# Patient Record
Sex: Female | Born: 1970 | Race: Black or African American | Hispanic: No | Marital: Single | Smoking: Never smoker
Health system: Southern US, Community
[De-identification: ages and names within clinical notes are randomized; demographics above are authoritative.]

## PROBLEM LIST (undated history)

## (undated) DIAGNOSIS — G4730 Sleep apnea, unspecified: Secondary | ICD-10-CM

## (undated) DIAGNOSIS — G43909 Migraine, unspecified, not intractable, without status migrainosus: Secondary | ICD-10-CM

---

## 2014-02-13 ENCOUNTER — Emergency Department (HOSPITAL_COMMUNITY)
Admission: EM | Admit: 2014-02-13 | Discharge: 2014-02-13 | Disposition: A | Discharge status: Home / Self Care | Payer: Medicare Other | Attending: Emergency Medicine | Admitting: Emergency Medicine

## 2014-02-13 ENCOUNTER — Emergency Department (HOSPITAL_COMMUNITY): Payer: Medicare Other

## 2014-02-13 ENCOUNTER — Encounter (HOSPITAL_COMMUNITY): Payer: Self-pay | Admitting: Emergency Medicine

## 2014-02-13 DIAGNOSIS — Z3202 Encounter for pregnancy test, result negative: Secondary | ICD-10-CM | POA: Diagnosis not present

## 2014-02-13 DIAGNOSIS — Z8679 Personal history of other diseases of the circulatory system: Secondary | ICD-10-CM | POA: Insufficient documentation

## 2014-02-13 DIAGNOSIS — R51 Headache: Secondary | ICD-10-CM | POA: Insufficient documentation

## 2014-02-13 DIAGNOSIS — R519 Headache, unspecified: Secondary | ICD-10-CM

## 2014-02-13 DIAGNOSIS — R112 Nausea with vomiting, unspecified: Secondary | ICD-10-CM | POA: Insufficient documentation

## 2014-02-13 HISTORY — DX: Migraine, unspecified, not intractable, without status migrainosus: G43.909

## 2014-02-13 HISTORY — DX: Sleep apnea, unspecified: G47.30

## 2014-02-13 LAB — POC URINE PREG, ED: PREG TEST UR: NEGATIVE

## 2014-02-13 MED ORDER — HYDROMORPHONE HCL 1 MG/ML IJ SOLN
0.5000 mg | Freq: Once | INTRAMUSCULAR | Status: AC
  Administered 2014-02-13: 0.5 mg via INTRAVENOUS
  Filled 2014-02-13: qty 1

## 2014-02-13 MED ORDER — SODIUM CHLORIDE 0.9 % IV BOLUS (SEPSIS)
1000.0000 mL | Freq: Once | INTRAVENOUS | Status: AC
  Administered 2014-02-13: 1000 mL via INTRAVENOUS

## 2014-02-13 MED ORDER — METOCLOPRAMIDE HCL 5 MG/ML IJ SOLN
10.0000 mg | Freq: Once | INTRAMUSCULAR | Status: AC
  Administered 2014-02-13: 10 mg via INTRAVENOUS
  Filled 2014-02-13: qty 2

## 2014-02-13 MED ORDER — DEXAMETHASONE SODIUM PHOSPHATE 10 MG/ML IJ SOLN
10.0000 mg | Freq: Once | INTRAMUSCULAR | Status: AC
  Administered 2014-02-13: 10 mg via INTRAVENOUS
  Filled 2014-02-13: qty 1

## 2014-02-13 MED ORDER — DIPHENHYDRAMINE HCL 50 MG/ML IJ SOLN
25.0000 mg | Freq: Once | INTRAMUSCULAR | Status: AC
  Administered 2014-02-13: 25 mg via INTRAVENOUS
  Filled 2014-02-13: qty 1

## 2014-02-13 MED ORDER — BUTALBITAL-APAP-CAFFEINE 50-325-40 MG PO TABS
1.0000 | ORAL_TABLET | ORAL | Status: AC
  Administered 2014-02-13: 1 via ORAL
  Filled 2014-02-13: qty 1

## 2014-02-13 MED ORDER — BUTALBITAL-APAP-CAFF-COD 50-325-40-30 MG PO CAPS: 1.0000 | ORAL_CAPSULE | Freq: Four times a day (QID) | ORAL | Status: AC | PRN

## 2014-02-13 NOTE — ED Notes (Signed)
Pt states no better at this point. pts young children provided juice and crackers at pts request.

## 2014-02-13 NOTE — ED Notes (Signed)
Per EMS pt from home with c/o migraine headache, hx of same, since last night. Also sts n/v, photophobia. Pt given fentanyl en route, no relief

## 2014-02-13 NOTE — ED Provider Notes (Signed)
CSN: [id number]     Arrival date & time 02/13/14  1601 History   First MD Initiated Contact with Patient 02/13/14 1604     Chief Complaint  Patient presents with  . Migraine     (Consider location/radiation/quality/duration/timing/severity/associated sxs/prior Treatment) Patient is a 43 y.o. female presenting with migraines. The history is provided by the patient.  Migraine This is a chronic problem. Episode onset: 2 days ago. The problem occurs constantly. The problem has been gradually worsening. Associated symptoms include headaches. Pertinent negatives include no chest pain, no abdominal pain and no shortness of breath. Nothing aggravates the symptoms. Nothing relieves the symptoms. She has tried nothing (does not like the way her migraine meds make her feel) for the symptoms. The treatment provided no relief.    No past medical history on file. No past surgical history on file. No family history on file. History  Substance Use Topics  . Smoking status: Not on file  . Smokeless tobacco: Not on file  . Alcohol Use: Not on file   OB History   No data available     Review of Systems  Constitutional: Negative for fever and fatigue.  HENT: Negative for congestion and drooling.   Eyes: Negative for pain.  Respiratory: Negative for cough and shortness of breath.   Cardiovascular: Negative for chest pain.  Gastrointestinal: Positive for nausea and vomiting. Negative for abdominal pain and diarrhea.  Genitourinary: Negative for dysuria and hematuria.  Musculoskeletal: Negative for back pain, gait problem and neck pain.  Skin: Negative for color change.  Neurological: Positive for headaches. Negative for dizziness.  Hematological: Negative for adenopathy.  Psychiatric/Behavioral: Negative for behavioral problems.  All other systems reviewed and are negative.     Allergies  Review of patient's allergies indicates not on file.  Home Medications   Prior to Admission  medications   Not on File   BP 104/89  Pulse 93  Temp(Src) 97.9 F (36.6 C) (Oral)  Resp 20  SpO2 100% Physical Exam  Nursing note and vitals reviewed. Constitutional: She is oriented to person, place, and time. She appears well-developed and well-nourished.  HENT:  Head: Normocephalic and atraumatic.  Mouth/Throat: Oropharynx is clear and moist. No oropharyngeal exudate.  Eyes: Conjunctivae and EOM are normal. Pupils are equal, round, and reactive to light.  Neck: Normal range of motion. Neck supple.  Normal rom of the neck.   Cardiovascular: Normal rate, regular rhythm, normal heart sounds and intact distal pulses.  Exam reveals no gallop and no friction rub.   No murmur heard. Pulmonary/Chest: Effort normal and breath sounds normal. No respiratory distress. She has no wheezes.  Abdominal: Soft. Bowel sounds are normal. There is no tenderness. There is no rebound and no guarding.  Musculoskeletal: Normal range of motion. She exhibits no edema and no tenderness.  Neurological: She is alert and oriented to person, place, and time.  alert, oriented x3 speech: normal in context and clarity memory: intact grossly cranial nerves II-XII: intact motor strength: full proximally and distally no involuntary movements or tremors sensation: intact to light touch diffusely  cerebellar: finger-to-nose and heel-to-shin intact gait: normal forwards and backwards  Skin: Skin is warm and dry.  Psychiatric: She has a normal mood and affect. Her behavior is normal.    ED Course  Procedures (including critical care time) Labs Review Labs Reviewed - No data to display  Imaging Review No results found.   EKG Interpretation None      MDM  Final diagnoses:  Headache    4:06 PM 43 y.o. female with a history of migraines who presents with a headache. She states that she has usually about 2 headaches per week. Her headache currently began gradually 2 days ago. She currently rates it a  10 out of 10. It is a frontal and temporal headache which is similar in location to her normal migraines but the intensity is worse. She denies any fevers or head trauma. She has had some nausea and vomiting today. She states that prior to arrival she was walking in her house when she began feeling dizzy and fell to the ground but did not hit her head. She is afebrile and vital signs are unremarkable here. Normal neurologic exam with the exception of gait as she wished to defer this due to the intensity of her headache currently. She notes that she has had imaging of her head previously and [place] She states that she [facility] to [facility] care. Will treat headache and reevaluate.  Pt stated that her HA was improving, but would like imaging now. Will order CT head.   9:52 PM: I interpreted/reviewed the labs and/or imaging which were non-contributory.  Pt's HA improved, she would like to go home. Do not think this is SAH based on sx improvement and HA c/w previous HA's although intensity worse. I have discussed the diagnosis/risks/treatment options with the patient and believe the pt to be eligible for discharge home to follow-up with her. We also discussed returning to the ED immediately if new or worsening sx occur. We discussed the sx which are most concerning (e.g., worsening HA, fever, weakness, numbness) that necessitate immediate return. Medications administered to the patient during their visit and any new prescriptions provided to the patient are listed below.  Medications given during this visit Medications  sodium chloride 0.9 % bolus 1,000 mL (0 mLs Intravenous Stopped 02/13/14 1838)  metoCLOPramide (REGLAN) injection 10 mg (10 mg Intravenous Given 02/13/14 1620)  diphenhydrAMINE (BENADRYL) injection 25 mg (25 mg Intravenous Given 02/13/14 1620)  dexamethasone (DECADRON) injection 10 mg (10 mg Intravenous Given  02/13/14 1621)  butalbital-acetaminophen-caffeine (FIORICET, ESGIC) 50-325-40 MG per tablet 1 tablet (1 tablet Oral Given 02/13/14 1934)  HYDROmorphone (DILAUDID) injection 0.5 mg (0.5 mg Intravenous Given 02/13/14 1838)    Discharge Medication List as of 02/13/2014  9:53 PM    START taking these medications   Details  butalbital-acetaminophen-caffeine (FIORICET/CODEINE) 50-325-40-30 MG per capsule Take 1 capsule by mouth every 6 (six) hours as needed for headache., Starting 02/13/2014, Until Discontinued, Print         Purvis SheffieldForrest Giana Castner, MD 02/14/14 0002

## 2014-02-13 NOTE — ED Notes (Signed)
Bed: [id number] Expected date:  Expected time:  Means of arrival:  Comments: ems migraine

## 2014-03-17 ENCOUNTER — Institutional Professional Consult (permissible substitution): Payer: Self-pay | Admitting: Pulmonary Disease

## 2014-04-08 ENCOUNTER — Institutional Professional Consult (permissible substitution): Payer: Medicare Other | Admitting: Pulmonary Disease

## 2014-05-12 ENCOUNTER — Institutional Professional Consult (permissible substitution): Payer: Medicare Other | Admitting: Pulmonary Disease

## 2016-03-09 IMAGING — CT CT HEAD W/O CM
2 series · 17 of 30 positions shown, 20 images · non-contrast
Comparison: None.

CLINICAL DATA: Severe headache for 3 days.

EXAM:
CT HEAD WITHOUT CONTRAST
TECHNIQUE: Contiguous axial images were obtained from the base of the skull
through the vertex without intravenous contrast.

[Series 2: head w/o · axial · non-contrast · 0.39mm/px · z∈[+1108,+1228]mm · 9 of 31 slices shown, 12 images]
[im 4/31  brain]
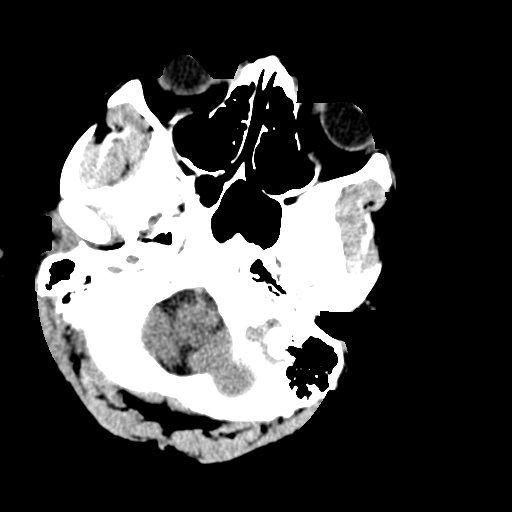
[im 4/31  bone]
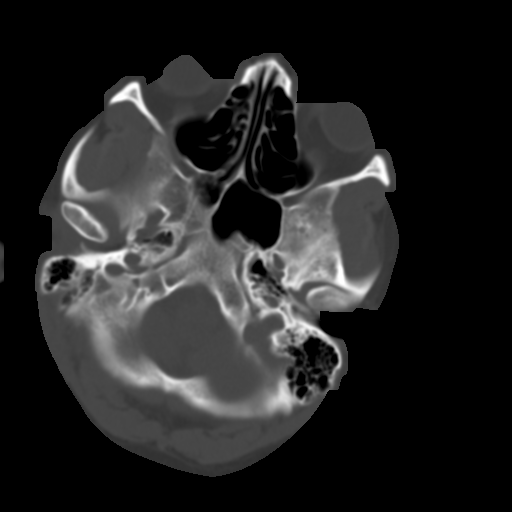
[im 7/31  brain]
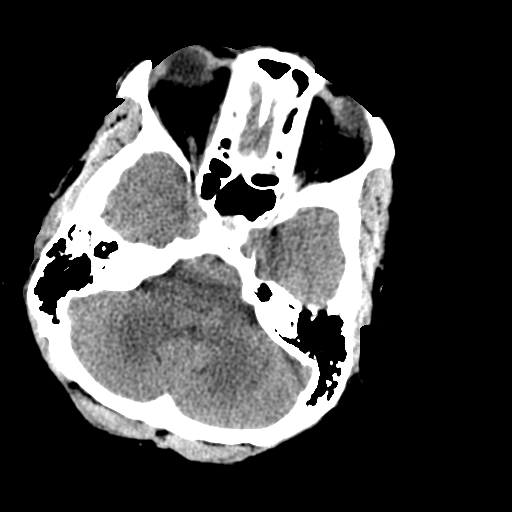
[im 10/31  brain]
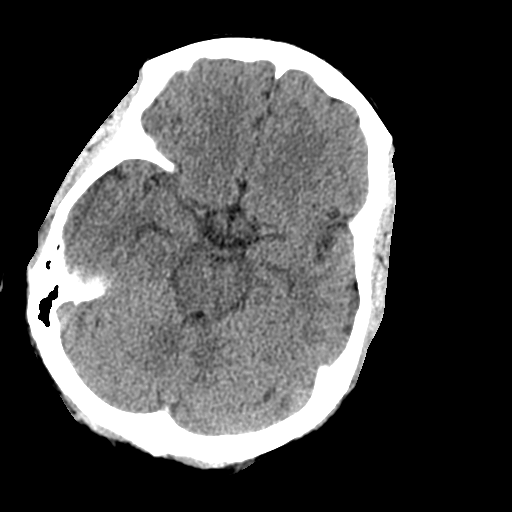
[im 13/31  brain]
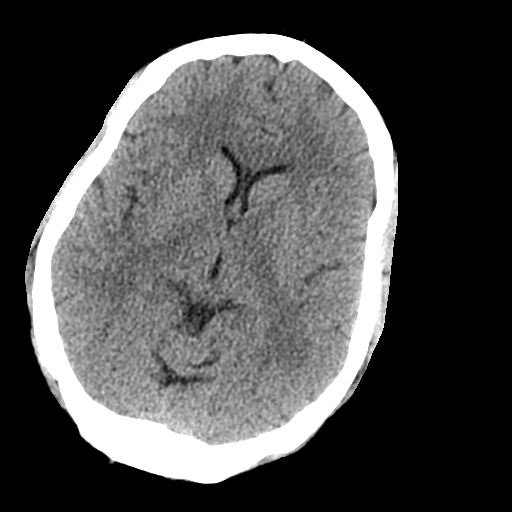
[im 16/31  brain]
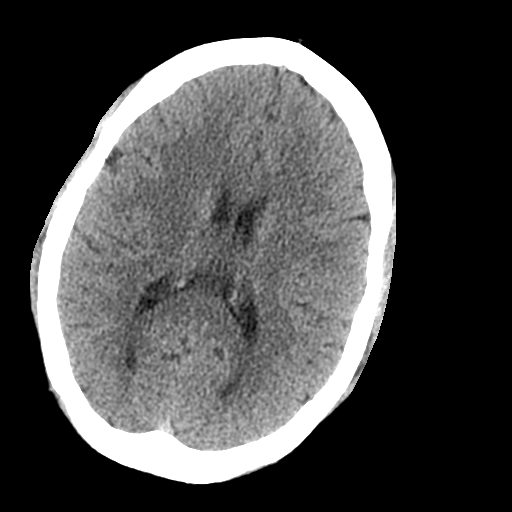
[im 16/31  bone]
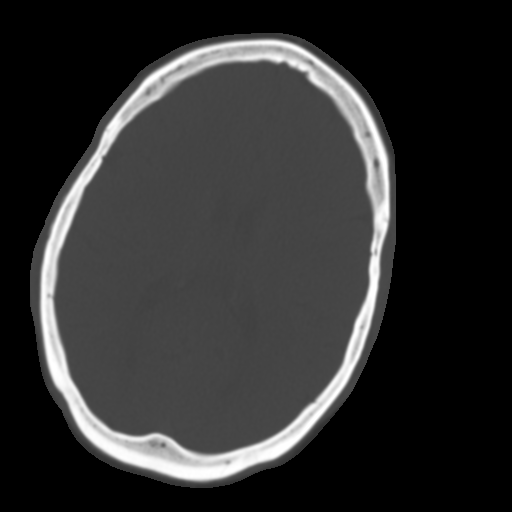
[im 19/31  brain]
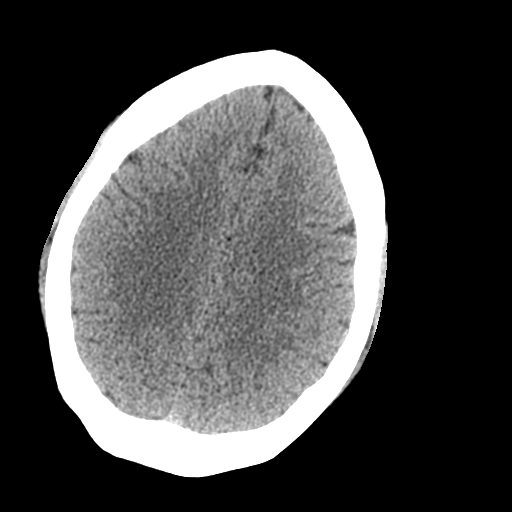
[im 22/31  brain]
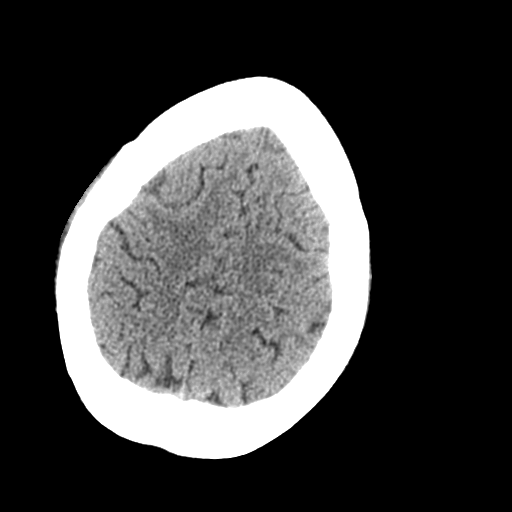
[im 25/31  brain]
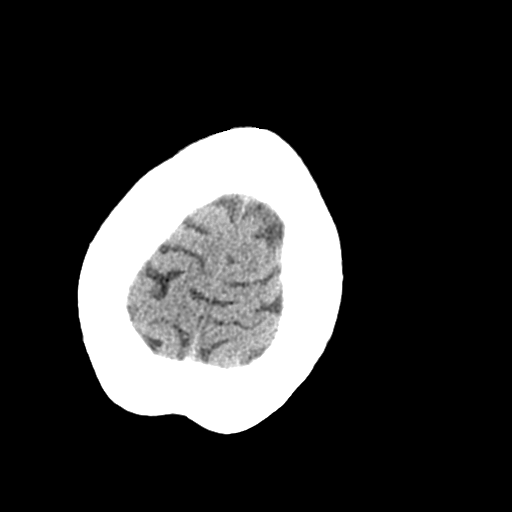
[im 28/31  brain]
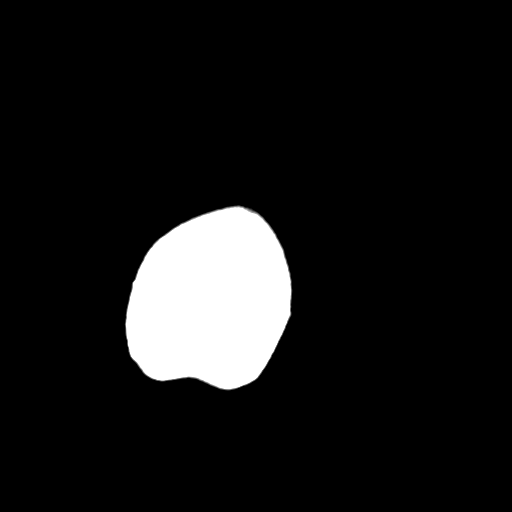
[im 28/31  bone]
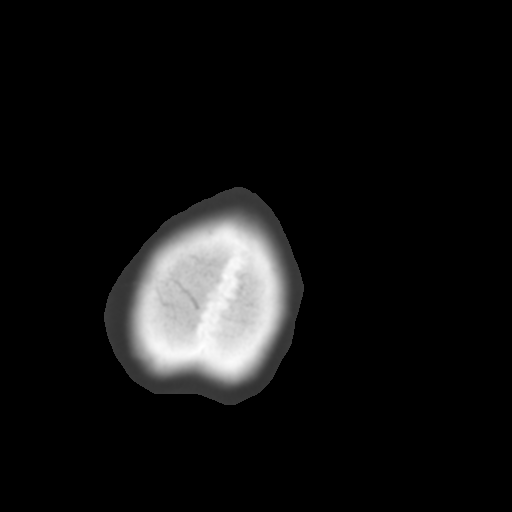

[Series 3: bone windows · axial · 0.39mm/px · z∈[+1108,+1225]mm · 8 of 51 slices shown]
[im 6/51  bone]
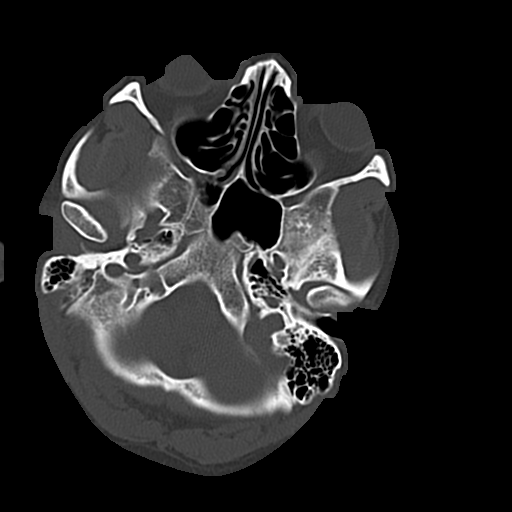
[im 12/51  bone]
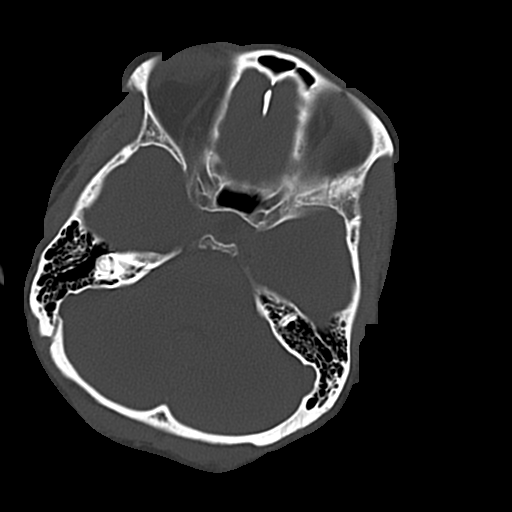
[im 17/51  bone]
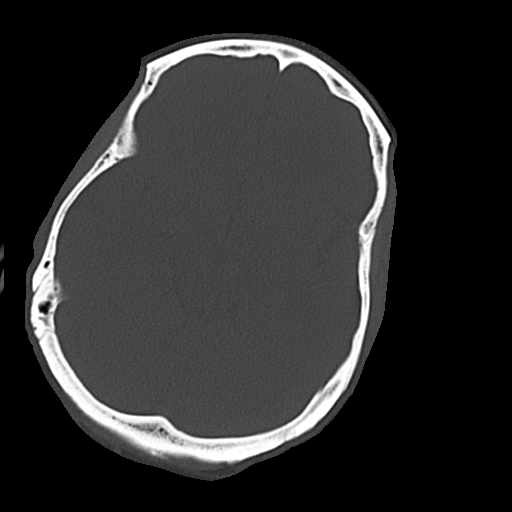
[im 23/51  bone]
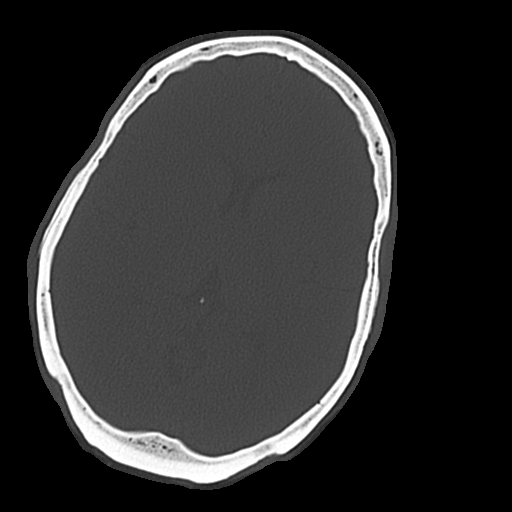
[im 28/51  bone]
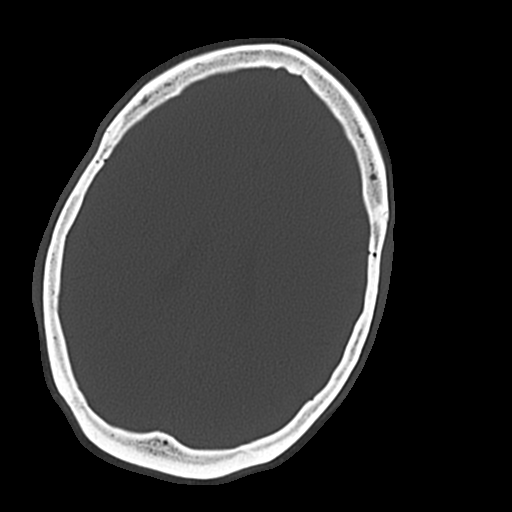
[im 34/51  bone]
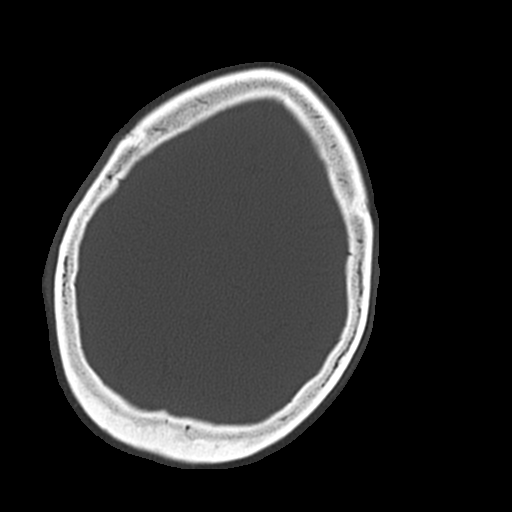
[im 39/51  bone]
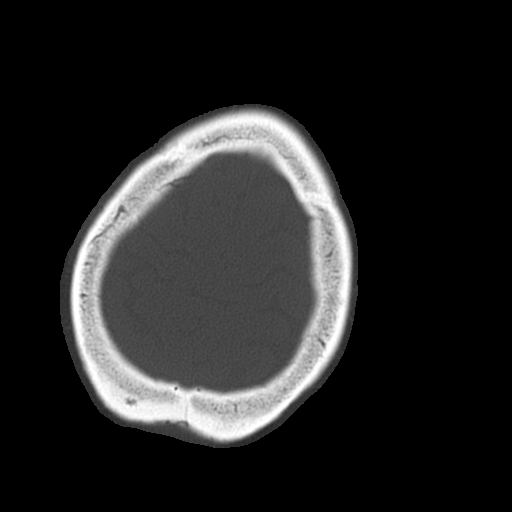
[im 45/51  bone]
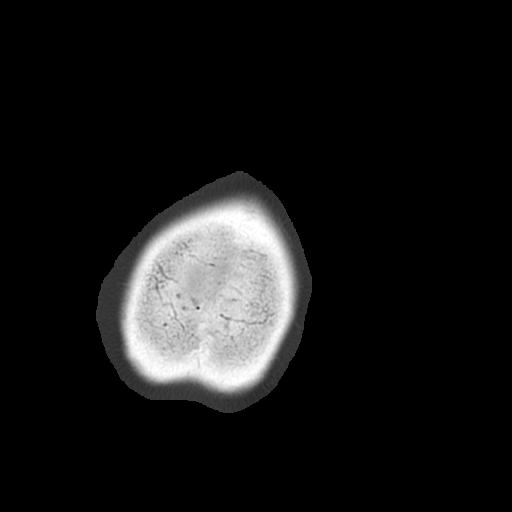

[17 of 30 positions shown; findings below may reference images not displayed]

FINDINGS: No evidence of intracranial hemorrhage, brain edema, or other signs
of acute infarction. No evidence of intracranial mass lesion or mass
effect.

No abnormal extraaxial fluid collections identified. Ventricles are
normal in size. No skull abnormality identified.
IMPRESSION: Negative noncontrast head CT.
